# Patient Record
Sex: Female | Born: 1955 | Race: Black or African American | Hispanic: No | Marital: Single | Smoking: Never smoker
Health system: Southern US, Community
[De-identification: ages and names within clinical notes are randomized; demographics above are authoritative.]

## PROBLEM LIST (undated history)

## (undated) DIAGNOSIS — Z973 Presence of spectacles and contact lenses: Secondary | ICD-10-CM

## (undated) DIAGNOSIS — I456 Pre-excitation syndrome: Secondary | ICD-10-CM

## (undated) DIAGNOSIS — I10 Essential (primary) hypertension: Secondary | ICD-10-CM

## (undated) DIAGNOSIS — M1990 Unspecified osteoarthritis, unspecified site: Secondary | ICD-10-CM

## (undated) DIAGNOSIS — J40 Bronchitis, not specified as acute or chronic: Secondary | ICD-10-CM

## (undated) DIAGNOSIS — D649 Anemia, unspecified: Secondary | ICD-10-CM

## (undated) HISTORY — PX: TONSILLECTOMY: SUR1361

## (undated) HISTORY — PX: CHOLECYSTECTOMY: SHX55

## (undated) HISTORY — DX: Pre-excitation syndrome: I45.6

## (undated) HISTORY — DX: Anemia, unspecified: D64.9

---

## 2005-10-08 ENCOUNTER — Other Ambulatory Visit: Admission: RE | Admit: 2005-10-08 | Discharge: 2005-10-08 | Payer: Self-pay | Admitting: Obstetrics and Gynecology

## 2005-10-17 ENCOUNTER — Encounter: Admission: RE | Admit: 2005-10-17 | Discharge: 2005-10-17 | Payer: Self-pay | Admitting: Obstetrics and Gynecology

## 2006-07-30 ENCOUNTER — Other Ambulatory Visit: Admission: RE | Admit: 2006-07-30 | Discharge: 2006-07-30 | Payer: Self-pay | Admitting: Obstetrics and Gynecology

## 2006-08-04 ENCOUNTER — Encounter: Admission: RE | Admit: 2006-08-04 | Discharge: 2006-08-04 | Payer: Self-pay | Admitting: Obstetrics and Gynecology

## 2006-10-20 ENCOUNTER — Encounter: Admission: RE | Admit: 2006-10-20 | Discharge: 2006-10-20 | Payer: Self-pay | Admitting: Obstetrics and Gynecology

## 2007-06-25 ENCOUNTER — Encounter: Admission: RE | Admit: 2007-06-25 | Discharge: 2007-06-25 | Payer: Self-pay | Admitting: Occupational Medicine

## 2007-10-26 ENCOUNTER — Encounter: Admission: RE | Admit: 2007-10-26 | Discharge: 2007-10-26 | Payer: Self-pay | Admitting: Obstetrics and Gynecology

## 2008-10-27 ENCOUNTER — Encounter: Admission: RE | Admit: 2008-10-27 | Discharge: 2008-10-27 | Payer: Self-pay | Admitting: Obstetrics and Gynecology

## 2008-11-08 ENCOUNTER — Encounter: Admission: RE | Admit: 2008-11-08 | Discharge: 2008-11-08 | Payer: Self-pay | Admitting: Obstetrics and Gynecology

## 2009-11-02 ENCOUNTER — Encounter: Admission: RE | Admit: 2009-11-02 | Discharge: 2009-11-02 | Payer: Self-pay | Admitting: Obstetrics and Gynecology

## 2009-11-08 ENCOUNTER — Ambulatory Visit: Payer: Self-pay | Admitting: Internal Medicine

## 2009-11-08 ENCOUNTER — Encounter: Payer: Self-pay | Admitting: Internal Medicine

## 2009-11-08 ENCOUNTER — Inpatient Hospital Stay (HOSPITAL_COMMUNITY): Admission: EM | Admit: 2009-11-08 | Discharge: 2009-11-09 | Payer: Self-pay | Admitting: Emergency Medicine

## 2009-11-09 ENCOUNTER — Encounter: Admission: RE | Admit: 2009-11-09 | Discharge: 2009-11-09 | Payer: Self-pay | Admitting: Obstetrics and Gynecology

## 2009-11-18 HISTORY — PX: CARDIAC ELECTROPHYSIOLOGY MAPPING AND ABLATION: SHX1292

## 2009-11-20 ENCOUNTER — Encounter: Payer: Self-pay | Admitting: Internal Medicine

## 2009-11-27 ENCOUNTER — Encounter: Payer: Self-pay | Admitting: Internal Medicine

## 2009-12-05 ENCOUNTER — Ambulatory Visit: Payer: Self-pay | Admitting: Internal Medicine

## 2009-12-05 DIAGNOSIS — E876 Hypokalemia: Secondary | ICD-10-CM | POA: Insufficient documentation

## 2009-12-05 DIAGNOSIS — I4891 Unspecified atrial fibrillation: Secondary | ICD-10-CM | POA: Insufficient documentation

## 2009-12-05 DIAGNOSIS — I10 Essential (primary) hypertension: Secondary | ICD-10-CM | POA: Insufficient documentation

## 2009-12-05 DIAGNOSIS — I456 Pre-excitation syndrome: Secondary | ICD-10-CM

## 2009-12-14 ENCOUNTER — Ambulatory Visit: Payer: Self-pay | Admitting: Internal Medicine

## 2009-12-14 LAB — CONVERTED CEMR LAB
BUN: 16 mg/dL (ref 6–23)
Basophils Absolute: 0 10*3/uL (ref 0.0–0.1)
Basophils Relative: 0.4 % (ref 0.0–3.0)
CO2: 26 meq/L (ref 19–32)
Calcium: 9.3 mg/dL (ref 8.4–10.5)
Chloride: 104 meq/L (ref 96–112)
Creatinine, Ser: 0.8 mg/dL (ref 0.4–1.2)
Eosinophils Absolute: 0.1 10*3/uL (ref 0.0–0.7)
Eosinophils Relative: 1.9 % (ref 0.0–5.0)
GFR calc non Af Amer: 96.8 mL/min (ref >60)
Glucose, Bld: 89 mg/dL (ref 70–99)
HCT: 32.8 % — ABNORMAL LOW (ref 36.0–46.0)
Hemoglobin: 10.3 g/dL — ABNORMAL LOW (ref 12.0–15.0)
INR: 1 (ref 0.8–1.0)
Lymphocytes Relative: 40.8 % (ref 12.0–46.0)
Lymphs Abs: 1.8 10*3/uL (ref 0.7–4.0)
MCHC: 31.4 g/dL (ref 30.0–36.0)
MCV: 79 fL (ref 78.0–100.0)
Monocytes Absolute: 0.3 10*3/uL (ref 0.1–1.0)
Monocytes Relative: 6.3 % (ref 3.0–12.0)
Neutro Abs: 2.3 10*3/uL (ref 1.4–7.7)
Neutrophils Relative %: 50.6 % (ref 43.0–77.0)
Platelets: 255 10*3/uL (ref 150.0–400.0)
Potassium: 4 meq/L (ref 3.5–5.1)
Prothrombin Time: 10.1 s (ref 9.1–11.7)
RBC: 4.15 M/uL (ref 3.87–5.11)
RDW: 14.9 % — ABNORMAL HIGH (ref 11.5–14.6)
Sodium: 140 meq/L (ref 135–145)
WBC: 4.5 10*3/uL (ref 4.5–10.5)
aPTT: 31.3 s — ABNORMAL HIGH (ref 21.7–28.8)

## 2009-12-21 ENCOUNTER — Ambulatory Visit: Payer: Self-pay | Admitting: Internal Medicine

## 2009-12-21 ENCOUNTER — Inpatient Hospital Stay (HOSPITAL_COMMUNITY): Admission: RE | Admit: 2009-12-21 | Discharge: 2009-12-22 | Payer: Self-pay | Admitting: Internal Medicine

## 2009-12-27 ENCOUNTER — Encounter: Payer: Self-pay | Admitting: Internal Medicine

## 2010-02-07 ENCOUNTER — Ambulatory Visit: Payer: Self-pay | Admitting: Internal Medicine

## 2010-11-13 ENCOUNTER — Encounter
Admission: RE | Admit: 2010-11-13 | Discharge: 2010-11-13 | Payer: Self-pay | Source: Home / Self Care | Attending: Obstetrics and Gynecology | Admitting: Obstetrics and Gynecology

## 2010-12-18 NOTE — Letter (Signed)
Summary: ELectrophysiology/Ablation Procedure Instructions  [employer], Main Office  [address]   [place], [place] [address]   Phone: [phone]  Fax: [phone]     Electrophysiology/Ablation Procedure Instructions    You are scheduled for a(n)  ablation on 12/21/09 at 7:30am with Dr. Ladona Ridgel.  1.  Please come to the Short Stay Center at [facility] at 6:00am on the day of your procedure.  2.  Come prepared to stay overnight.   Please bring your insurance cards and a list of your medications.  3.  Come to the [place] office on 12/14/09 at 9:00am for lab work.  The lab at [facility] is open from 8:30 AM to 1:30 PM and 2:30 PM to 5:00 PM.  .  You do not have to be fasting.  4.  Do not have anything to eat or drink after midnight the night before your procedure.  5. All of your  medications may be taken with a small amount of water.  6.  Educational material received:  _____ EP   _____ Ablation   * Occasionally, EP studies and ablations can become lengthy.  Please make your family aware of this before your procedure starts.  Average time ranges from 2-8 hours for EP studies/ablations.  Your physician will locate your family after the procedure with the results.  * If you have any questions after you get home, please call the office at [phone]. Anselm Pancoast

## 2010-12-18 NOTE — Assessment & Plan Note (Signed)
Summary: eph/ gd   CC:  follow up pt states she feels like her heart is speending up from time to time.  History of Present Illness: [name] returns today for followup.  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  She underwent EPS/RFA of her WPW syndrome several weeks ago.  She returns today for followup.  She denies recurrent symptoms.  Current Medications (verified): 1)  Losartan Potassium-Hctz 50-12.5 Mg Tabs (Losartan Potassium-Hctz) .Marland Kitchen.. 1 Tab Po Once Daily 2)  Aspirin Ec 325 Mg Tbec (Aspirin) .... Take One Tablet By Mouth Daily 3)  Potassium Chloride Crys Cr 20 Meq Cr-Tabs (Potassium Chloride Crys Cr) .... Take One Tablet By Mouth Daily 4)  Multivitamins   Tabs (Multiple Vitamin) .... Take One Tablet By Mouth Once Daily. 5)  Caltrate 600+d 600-400 Mg-Unit Tabs (Calcium Carbonate-Vitamin D) .Marland Kitchen.. 1 Tablet 3-4 Times Per Week  Allergies: 1)  ! Cardizem  Past History:  Past Medical History: Last updated: 12/05/2009 Current Problems:  ATRIAL FIBRILLATION (ICD-427.31) HYPOKALEMIA (ICD-276.8) HYPERTENSION (ICD-401.9) WOLFF (WOLFE)-PARKINSON-WHITE (WPW) SYNDROME (ICD-426.7)    Past Surgical History: Last updated: 12/05/2009 Cholecystectomy 1991 Tonsilectomy 2000  Review of Systems  The patient denies chest pain, syncope, dyspnea on exertion, and peripheral edema.    Vital Signs:  Patient profile:   55 year old female Height:      65 inches Weight:      260 pounds BMI:     43.42 Pulse rate:   84 / minute Resp:     14 per minute BP sitting:   104 / 73  (left arm)  Vitals Entered By: Kem Parkinson (February 07, 2010 9:44 AM)  Physical Exam  General:  Obes, middle aged woman well developed, well nourished, in no acute distress.  HEENT: normal Neck: supple. No JVD. Carotids 2+ bilaterally no bruits Cor: RRR no rubs, gallops or murmur Lungs: CTA [id number], soft, nontender. nondistended. No HSM. Good bowel sounds Ext: warm. no cyanosis, clubbing or  edema Neuro: alert and oriented. Grossly nonfocal. affect pleasant    EKG  Procedure date:  02/07/2010  Findings:      Normal sinus rhythm with rate of:  84. No ventricular pre-excitation is present.  Impression & Recommendations:  Problem # 1:  WOLFF (WOLFE)-PARKINSON-WHITE (WPW) SYNDROME (ICD-426.7) She has no evidence of recurrent WPW syndrome at this time.  She will continue a period of watchful waiting.  The likelihood of this recurring is very low. Her updated medication list for this problem includes:    Aspirin Ec 325 Mg Tbec (Aspirin) .Marland Kitchen... Take one tablet by mouth daily  Problem # 2:  MORBID OBESITY (ICD-278.01) Today, I have discussed the importance of weight loss and its implications.  She will try to lose weight.

## 2010-12-18 NOTE — Assessment & Plan Note (Signed)
Summary: new ep eval/afib/jss   History of Present Illness: Denise Deleon is referred for evaluation of WPW syndrome.  The patient notes that she has had palpitations for over 20 yrs.  These episodes have typically occured suddenly without warning, lasting up to several hrs at a time.  She has associated c/p, sob, and near syncope.  She has never had frank syncope.  The patient has noted that over the past year, her symptoms have increased in frequency and severity.  She was hospitalized with a severe episode almost one month ago and at that time she was in sustained SVT at almost 200/min.  She has tried calcium channel blockers without improvement (also used for HTN).  Current Medications (verified): 1)  Hydrochlorothiazide 25 Mg Tabs (Hydrochlorothiazide) .... Take One Tablet By Mouth Daily. 2)  Aspirin Ec 325 Mg Tbec (Aspirin) .... Take One Tablet By Mouth Daily 3)  Potassium Chloride Crys Cr 20 Meq Cr-Tabs (Potassium Chloride Crys Cr) .... Take One Tablet By Mouth Daily 4)  Multivitamins   Tabs (Multiple Vitamin) .... Take One Tablet By Mouth Once Daily. 5)  Caltrate 600+d 600-400 Mg-Unit Tabs (Calcium Carbonate-Vitamin D) .Marland Kitchen.. 1 Tablet 3-4 Times Per Week  Allergies (verified): 1)  ! Cardizem  Past History:  Past Medical History: Last updated: 12/05/2009 Current Problems:  ATRIAL FIBRILLATION (ICD-427.31) HYPOKALEMIA (ICD-276.8) HYPERTENSION (ICD-401.9) WOLFF (WOLFE)-PARKINSON-WHITE (WPW) SYNDROME (ICD-426.7)    Past Surgical History: Cholecystectomy 1991 Tonsilectomy Apr 03, 1999  Family History: Mother died of a stroke 5 Father died of throat CA  Social History:  No tobacco, alcohol or drugs.  Single.  Review of Systems       All systems reviewed and negative  except for some mild arthritic complaints in the hands.  Vital Signs:  Patient profile:   55 year old female Height:      65 inches Weight:      265 pounds BMI:     44.26 Pulse rate:   81 / minute BP sitting:   132 /  78  (left arm)  Vitals Entered By: Laurance Flatten CMA (December 05, 2009 12:53 PM)  Physical Exam  General:  Obes, middle aged woman well developed, well nourished, in no acute distress.  HEENT: normal Neck: supple. No JVD. Carotids 2+ bilaterally no bruits Cor: RRR no rubs, gallops or murmur Lungs: CTA [id number], soft, nontender. nondistended. No HSM. Good bowel sounds Ext: warm. no cyanosis, clubbing or edema Neuro: alert and oriented. Grossly nonfocal. affect pleasant    EKG  Procedure date:  12/05/2009  Findings:      Normal sinus rhythm with rate of:  81. WPW synrdrome.  Impression & Recommendations:  Problem # 1:  WOLFF (WOLFE)-PARKINSON-WHITE (WPW) SYNDROME (ICD-426.7) The patient has symptoms, documented SVT and despite medical therapy has had recurrent episodes (not as symptomatic has previously).  I have discussed the risks/benefits/goals/and expectations of ablation with the patient and she would like to proceed.  Her procedure will be scheduled at the earlist possible convenient time. Her updated medication list for this problem includes:    Aspirin Ec 325 Mg Tbec (Aspirin) .Marland Kitchen... Take one tablet by mouth daily  Orders: EKG w/ Interpretation (93[address])  Problem # 2:  HYPERTENSION (ICD-401.9) She will continue her current meds and maintain a low sodium diet. I have encouraged her in weight loss. Her updated medication list for this problem includes:    Hydrochlorothiazide 25 Mg Tabs (Hydrochlorothiazide) .Marland Kitchen... Take one tablet by mouth daily.    Aspirin Ec 325 Mg  Tbec (Aspirin) .Marland Kitchen... Take one tablet by mouth daily  Problem # 3:  ATRIAL FIBRILLATION (ICD-427.31) I cannot find any evidence of atrial fibrillation though with her SVT and pre-excited QRS, she is at risk for atrial fibrillation. Her updated medication list for this problem includes:    Aspirin Ec 325 Mg Tbec (Aspirin) .Marland Kitchen... Take one tablet by mouth daily  Orders: EKG w/ Interpretation ([address])

## 2010-12-18 NOTE — Letter (Signed)
Summary: [employer] Authorization  [employer] Authorization   Imported By: Roderic Ovens 02/22/2010 14:51:41  _____________________________________________________________________  External Attachment:    Type:   Image     Comment:   External Document

## 2010-12-18 NOTE — Letter (Signed)
Summary: Denise Deleon Physician OV with GYN History  [facility] OV with GYN History   Imported By: Roderic Ovens 12/19/2009 11:16:39  _____________________________________________________________________  External Attachment:    Type:   Image     Comment:   External Document

## 2011-02-18 LAB — POCT CARDIAC MARKERS
CKMB, poc: <1 ng/mL — ABNORMAL LOW (ref 1.0–8.0)
Myoglobin, poc: 63 ng/mL (ref 12–200)
Troponin i, poc: <0.05 ng/mL (ref 0.00–0.09)

## 2011-02-18 LAB — BASIC METABOLIC PANEL
BUN: 10 mg/dL (ref 6–23)
BUN: 7 mg/dL (ref 6–23)
CO2: 21 mEq/L (ref 19–32)
CO2: 26 mEq/L (ref 19–32)
Calcium: 8.6 mg/dL (ref 8.4–10.5)
Calcium: 8.6 mg/dL (ref 8.4–10.5)
Chloride: 107 mEq/L (ref 96–112)
Chloride: 98 mEq/L (ref 96–112)
Creatinine, Ser: 0.64 mg/dL (ref 0.4–1.2)
Creatinine, Ser: 0.67 mg/dL (ref 0.4–1.2)
GFR calc Af Amer: >60 mL/min (ref >60)
GFR calc Af Amer: >60 mL/min (ref >60)
GFR calc non Af Amer: >60 mL/min (ref >60)
GFR calc non Af Amer: >60 mL/min (ref >60)
Glucose, Bld: 98 mg/dL (ref 70–99)
Glucose, Bld: 99 mg/dL (ref 70–99)
Potassium: 3.4 mEq/L — ABNORMAL LOW (ref 3.5–5.1)
Potassium: 3.4 mEq/L — ABNORMAL LOW (ref 3.5–5.1)
Sodium: 130 mEq/L — ABNORMAL LOW (ref 135–145)
Sodium: 140 mEq/L (ref 135–145)

## 2011-02-18 LAB — URINALYSIS, ROUTINE W REFLEX MICROSCOPIC
Bilirubin Urine: NEGATIVE
Glucose, UA: NEGATIVE mg/dL
Hgb urine dipstick: NEGATIVE
Ketones, ur: NEGATIVE mg/dL
Nitrite: NEGATIVE
Protein, ur: NEGATIVE mg/dL
Specific Gravity, Urine: 1.005 (ref 1.005–1.030)
Urobilinogen, UA: 0.2 mg/dL (ref 0.0–1.0)
pH: 6.5 (ref 5.0–8.0)

## 2011-02-18 LAB — DIFFERENTIAL
Basophils Absolute: 0 10*3/uL (ref 0.0–0.1)
Basophils Relative: 1 % (ref 0–1)
Eosinophils Absolute: 0.1 10*3/uL (ref 0.0–0.7)
Eosinophils Relative: 1 % (ref 0–5)
Lymphocytes Relative: 47 % — ABNORMAL HIGH (ref 12–46)
Lymphs Abs: 3.6 10*3/uL (ref 0.7–4.0)
Monocytes Absolute: 0.4 10*3/uL (ref 0.1–1.0)
Monocytes Relative: 5 % (ref 3–12)
Neutro Abs: 3.6 10*3/uL (ref 1.7–7.7)
Neutrophils Relative %: 47 % (ref 43–77)

## 2011-02-18 LAB — CBC
HCT: 31.8 % — ABNORMAL LOW (ref 36.0–46.0)
Hemoglobin: 10.3 g/dL — ABNORMAL LOW (ref 12.0–15.0)
MCHC: 32.5 g/dL (ref 30.0–36.0)
MCV: 76.9 fL — ABNORMAL LOW (ref 78.0–100.0)
Platelets: 258 10*3/uL (ref 150–400)
RBC: 4.14 MIL/uL (ref 3.87–5.11)
RDW: 15.2 % (ref 11.5–15.5)
WBC: 7.7 10*3/uL (ref 4.0–10.5)

## 2011-02-18 LAB — TSH: TSH: 2.236 u[IU]/mL (ref 0.350–4.500)

## 2011-02-18 LAB — PHOSPHORUS: Phosphorus: 4.9 mg/dL — ABNORMAL HIGH (ref 2.3–4.6)

## 2011-02-18 LAB — MAGNESIUM: Magnesium: 2 mg/dL (ref 1.5–2.5)

## 2011-06-18 ENCOUNTER — Encounter: Payer: Self-pay | Admitting: Podiatry

## 2011-07-03 ENCOUNTER — Ambulatory Visit: Payer: Self-pay

## 2011-07-03 ENCOUNTER — Other Ambulatory Visit: Payer: Self-pay | Admitting: Occupational Medicine

## 2011-07-03 DIAGNOSIS — R52 Pain, unspecified: Secondary | ICD-10-CM

## 2011-09-05 IMAGING — CR DG HAND COMPLETE 3+V*L*
4 series · 4 of 4 positions shown · non-contrast
Comparison: None.

CLINICAL DATA: Injured hand yesterday with pain

LEFT HAND - COMPLETE 3+ VIEW

[view not recorded (1 of 4)]
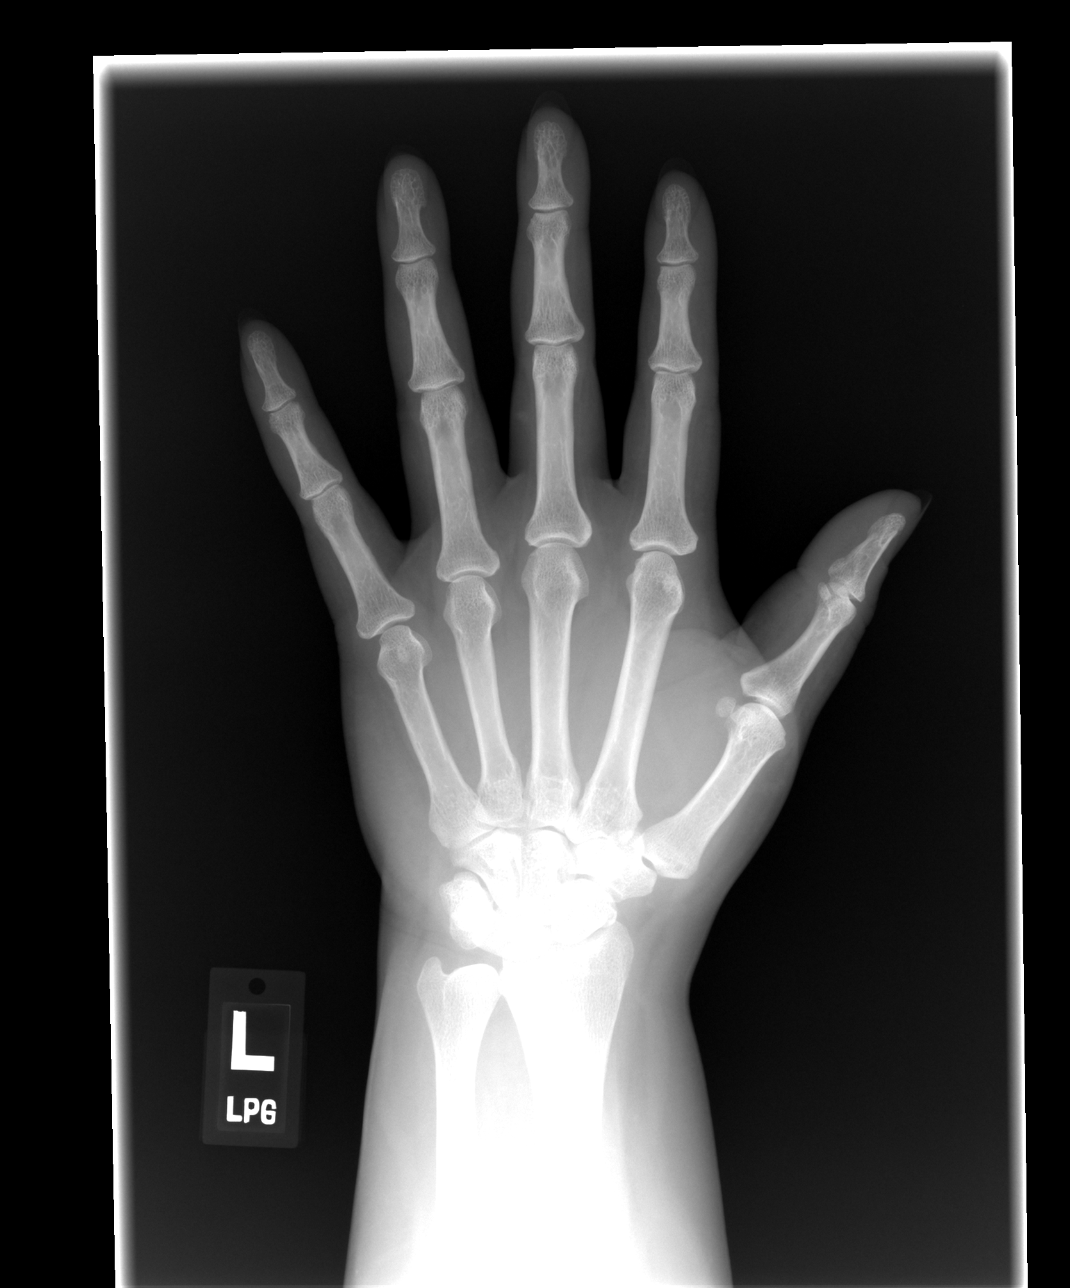

[view not recorded (2 of 4)]
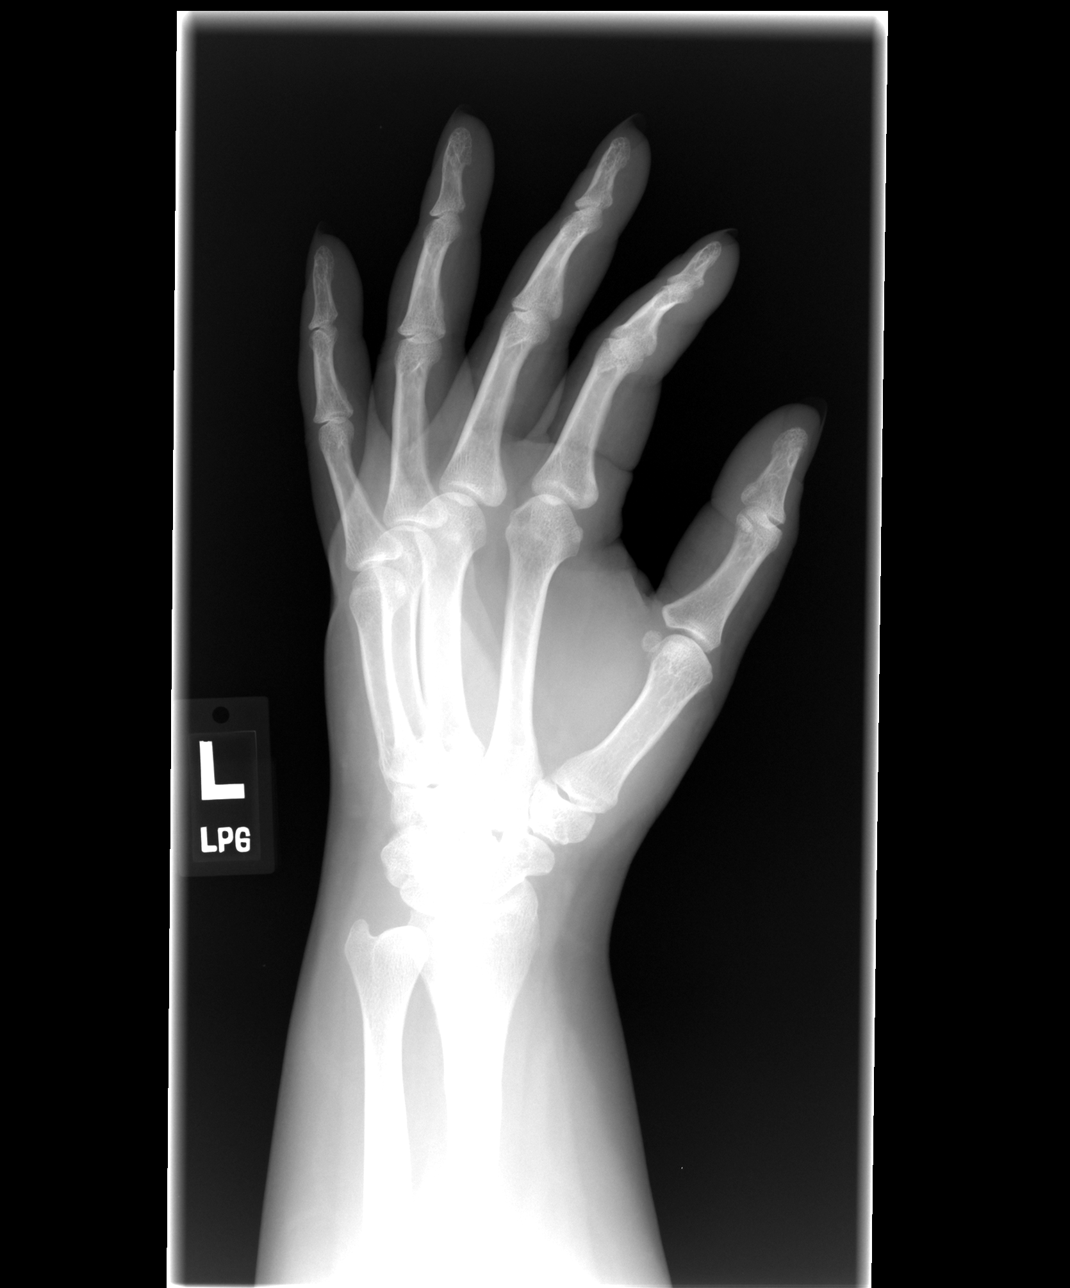

[view not recorded (3 of 4)]
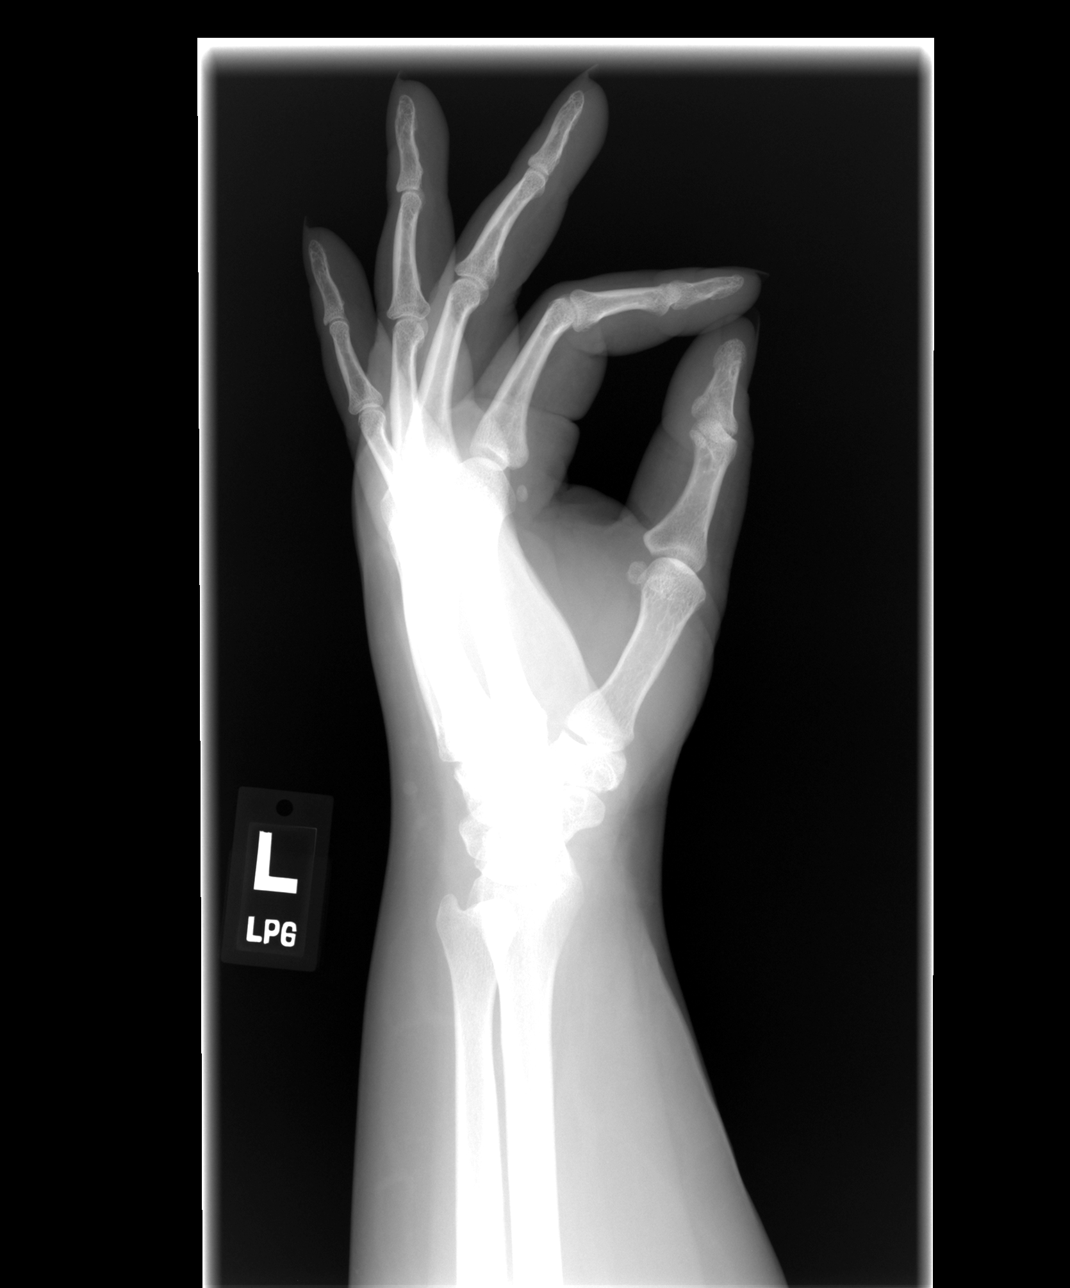

[view not recorded (4 of 4)]
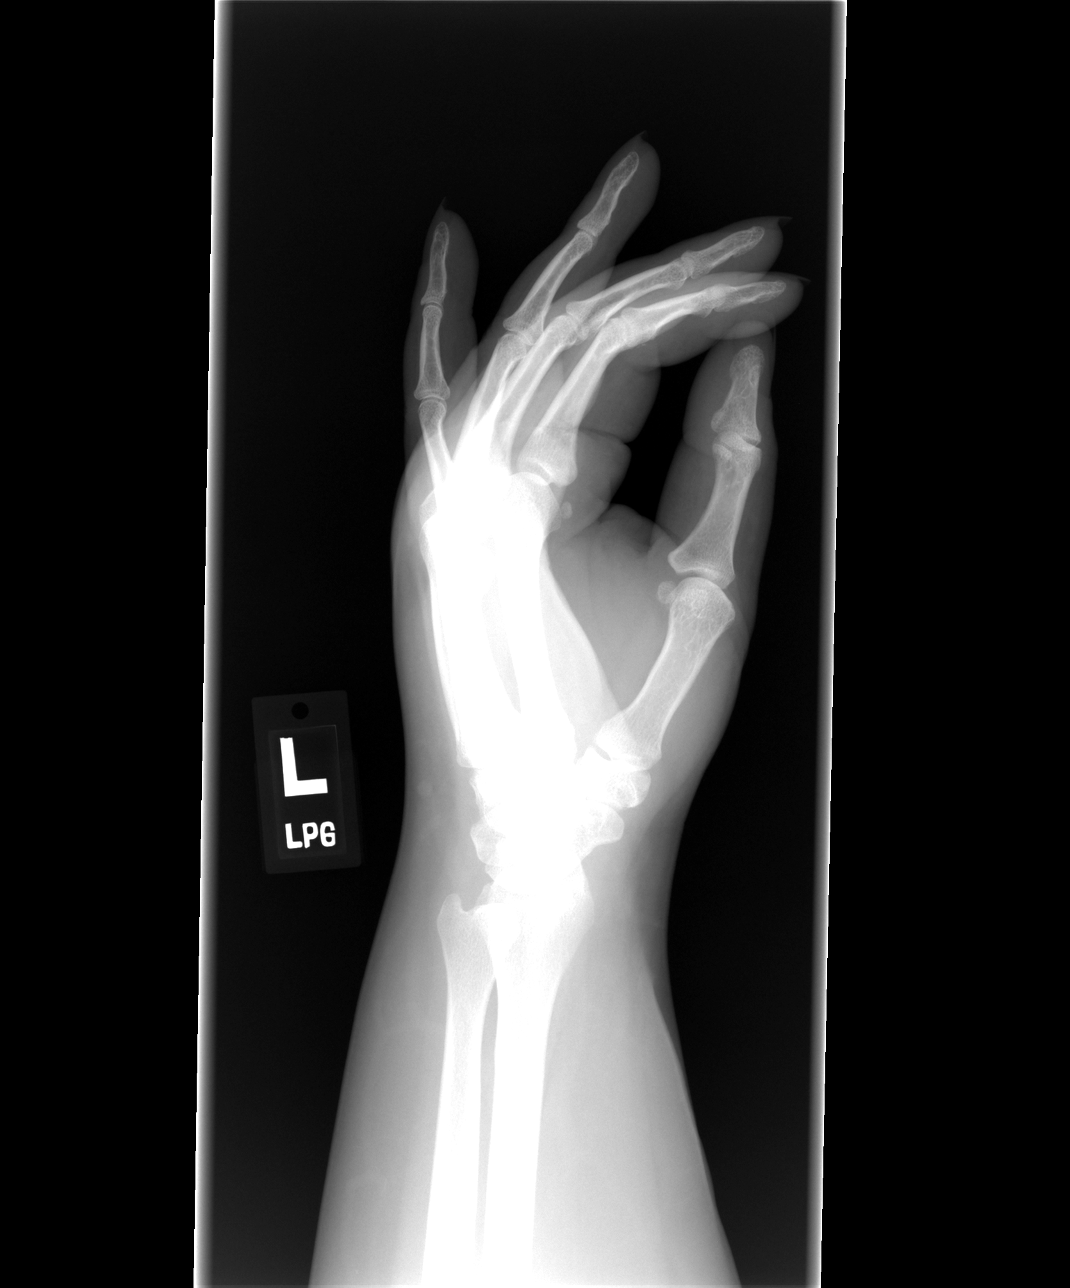

[4 of 4 positions shown; findings below may reference images not displayed]

FINDINGS: The radiocarpal joint space appears normal.  The carpal
bones are in normal position.  MCP, PIP, and DIP joints appear
normal.  No acute abnormality is seen
IMPRESSION: Negative.

## 2011-10-15 ENCOUNTER — Other Ambulatory Visit: Payer: Self-pay | Admitting: Obstetrics and Gynecology

## 2011-10-15 DIAGNOSIS — Z1231 Encounter for screening mammogram for malignant neoplasm of breast: Secondary | ICD-10-CM

## 2011-11-15 ENCOUNTER — Ambulatory Visit
Admission: RE | Admit: 2011-11-15 | Discharge: 2011-11-15 | Disposition: A | Discharge status: Home / Self Care | Payer: BC Managed Care – PPO | Source: Ambulatory Visit | Attending: Obstetrics and Gynecology | Admitting: Obstetrics and Gynecology

## 2011-11-15 DIAGNOSIS — Z1231 Encounter for screening mammogram for malignant neoplasm of breast: Secondary | ICD-10-CM

## 2012-01-17 DIAGNOSIS — M1990 Unspecified osteoarthritis, unspecified site: Secondary | ICD-10-CM

## 2012-01-17 HISTORY — DX: Unspecified osteoarthritis, unspecified site: M19.90

## 2012-09-24 ENCOUNTER — Emergency Department (HOSPITAL_COMMUNITY)
Admission: EM | Admit: 2012-09-24 | Discharge: 2012-09-24 | Disposition: A | Discharge status: Home / Self Care | Payer: BC Managed Care – PPO | Attending: Emergency Medicine | Admitting: Emergency Medicine

## 2012-09-24 ENCOUNTER — Encounter (HOSPITAL_COMMUNITY): Payer: Self-pay

## 2012-09-24 DIAGNOSIS — Y9389 Activity, other specified: Secondary | ICD-10-CM | POA: Insufficient documentation

## 2012-09-24 DIAGNOSIS — S199XXA Unspecified injury of neck, initial encounter: Secondary | ICD-10-CM

## 2012-09-24 DIAGNOSIS — D649 Anemia, unspecified: Secondary | ICD-10-CM | POA: Insufficient documentation

## 2012-09-24 DIAGNOSIS — G20A1 Parkinson's disease without dyskinesia, without mention of fluctuations: Secondary | ICD-10-CM | POA: Insufficient documentation

## 2012-09-24 DIAGNOSIS — G20 Parkinson's disease: Secondary | ICD-10-CM | POA: Insufficient documentation

## 2012-09-24 DIAGNOSIS — M129 Arthropathy, unspecified: Secondary | ICD-10-CM | POA: Insufficient documentation

## 2012-09-24 DIAGNOSIS — I10 Essential (primary) hypertension: Secondary | ICD-10-CM | POA: Insufficient documentation

## 2012-09-24 DIAGNOSIS — S0993XA Unspecified injury of face, initial encounter: Secondary | ICD-10-CM | POA: Insufficient documentation

## 2012-09-24 DIAGNOSIS — Z79899 Other long term (current) drug therapy: Secondary | ICD-10-CM | POA: Insufficient documentation

## 2012-09-24 HISTORY — DX: Unspecified osteoarthritis, unspecified site: M19.90

## 2012-09-24 HISTORY — DX: Essential (primary) hypertension: I10
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
## 2012-10-13 ENCOUNTER — Other Ambulatory Visit: Payer: Self-pay | Admitting: Obstetrics and Gynecology

## 2012-10-13 DIAGNOSIS — Z1231 Encounter for screening mammogram for malignant neoplasm of breast: Secondary | ICD-10-CM

## 2012-11-17 ENCOUNTER — Ambulatory Visit
Admission: RE | Admit: 2012-11-17 | Discharge: 2012-11-17 | Disposition: A | Discharge status: Home / Self Care | Payer: BC Managed Care – PPO | Source: Ambulatory Visit | Attending: Obstetrics and Gynecology | Admitting: Obstetrics and Gynecology

## 2012-11-17 DIAGNOSIS — Z1231 Encounter for screening mammogram for malignant neoplasm of breast: Secondary | ICD-10-CM

## 2012-11-19 ENCOUNTER — Other Ambulatory Visit: Payer: Self-pay | Admitting: Obstetrics and Gynecology

## 2012-11-19 DIAGNOSIS — N6310 Unspecified lump in the right breast, unspecified quadrant: Secondary | ICD-10-CM

## 2012-11-23 NOTE — Progress Notes (Signed)
Quick Note:  The recent mammogram is incomplete / abnormal suggesting a close follow-up / additional images. When is the next appointment to follow-up on this mammogram?  Please document in chart. ______ 

## 2012-11-24 ENCOUNTER — Telehealth: Payer: Self-pay

## 2012-11-24 NOTE — Telephone Encounter (Signed)
Message copied by Darien Ramus on Tue Nov 24, 2012  2:53 PM ------      Message from: Larwance Rote      Created: Tue Nov 24, 2012 11:16 AM                   ----- Message -----         From: Esmeralda Arthur, MD         Sent: 11/23/2012   4:29 PM           To: Butler Denmark, CMA            The recent mammogram is incomplete / abnormal suggesting a close follow-up / additional images.      When is the next appointment to follow-up on this mammogram?       Please document in chart.

## 2012-11-24 NOTE — Telephone Encounter (Signed)
Per [facility] pt is scheduled for f/u MM on 11/25/2012  Darien Ramus, CMA

## 2012-11-25 ENCOUNTER — Ambulatory Visit
Admission: RE | Admit: 2012-11-25 | Discharge: 2012-11-25 | Disposition: A | Discharge status: Home / Self Care | Payer: BC Managed Care – PPO | Source: Ambulatory Visit | Attending: Obstetrics and Gynecology | Admitting: Obstetrics and Gynecology

## 2012-11-25 DIAGNOSIS — N6310 Unspecified lump in the right breast, unspecified quadrant: Secondary | ICD-10-CM

## 2012-11-26 ENCOUNTER — Encounter: Payer: Self-pay | Admitting: Obstetrics and Gynecology

## 2012-11-26 ENCOUNTER — Ambulatory Visit (INDEPENDENT_AMBULATORY_CARE_PROVIDER_SITE_OTHER): Payer: BC Managed Care – PPO | Admitting: Obstetrics and Gynecology

## 2012-11-26 VITALS — BP 118/76 | HR 82 | Ht 64.25 in | Wt 261.0 lb

## 2012-11-26 DIAGNOSIS — R3915 Urgency of urination: Secondary | ICD-10-CM

## 2012-11-26 DIAGNOSIS — Z124 Encounter for screening for malignant neoplasm of cervix: Secondary | ICD-10-CM

## 2012-11-26 DIAGNOSIS — Z01419 Encounter for gynecological examination (general) (routine) without abnormal findings: Secondary | ICD-10-CM

## 2012-11-26 NOTE — Progress Notes (Signed)
Subjective:    Denise Deleon is a 57 y.o. female G0P0 who presents for annual exam. The patient has no complaints today but wants a "knot" that is asymptomatic, evaluated.  This knot is in her lower abdomen and she only feels it when she is standing-not lying down.    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  Review of Systems Gastrointestinal:No change in bowel habits, no abdominal pain, no rectal bleeding Genitourinary:negative for abnormal vaginal bleeding,  dysuria, frequency, hematuria, nocturia and urinary incontinence   Objective:     BP 118/76  Pulse 82  Ht 5' 4.25" (1.632 m)  Wt 261 lb (118.389 kg)  BMI 44.45 kg/m2 Weight:  Wt Readings from Last 1 Encounters:  11/26/12 261 lb (118.389 kg)   Body mass index is 44.45 kg/(m^2). General Appearance:  Well nourished in no acute distress HEENT: Grossly normal Neck / Thyroid: Supple, no masses, nodes or enlargement Lungs: Clear to auscultation bilaterally Back: No CVA tenderness Breast Exam: No masses or nodes.No dimpling, nipple retraction or discharge. Cardiovascular: Regular rate and rhythm.  Gastrointestinal: Soft, non-tender;  left lower quadrant, 3 fingers above hairline organomegaly ? defect vs firm mass (approx. 1.5 cm) that is non-tender and is only palpable with standing Pelvic Exam: EGBUS-normal, vagina-atrophic/narrowed, cervix-atrophic and stenotic no tenderness or lesions, uterus-appears normal size, shape and consistency, though exam limited by habitus,  adnexae-no masses or tenderness Rectovaginal: inadequate due to patient discomfort Lymphatic Exam: Non-palpable nodes in neck, clavicular, axillary, or inguinal regions Skin: No rash or abnormalities Extremities: no clubbing cyanosis or edema Neurologic: Grossly normal Psychiatric: Alert and oriented x 3    Assessment:   Routine GYN Exam Left Lower Quadrant Abdominal Wall Defect/Hernia?   Plan:   PAP sent  Patient to notify office if she  wants evaluation of ? abdominal wall defect/hernia  RTO 1 year or prn   Ladeana Laplant,ELMIRAPA-C

## 2012-11-26 NOTE — Progress Notes (Signed)
Regular Periods: no Mammogram: yes  Monthly Breast Ex.: yes Exercise: no  Tetanus < 10 years: yes Seatbelts: yes  NI. Bladder Functn.: yes Abuse at home: no  Daily BM's: yes Stressful Work: yes  Healthy Diet: yes Sigmoid-Colonoscopy: YES  2007/08  Calcium: yes Medical problems this year: 2 LUMPS ON LEFT SIDE IN PUBIC AREA   LAST PAP:1/13  Contraception: PM  Mammogram:  11-17-12;U/S  11/25/12  PCP: DR. POLITE  PMH: RHEUMATOID ARTHRITIS DX 3/13  FMH: NO CHANGE  Last Bone Scan: NEVER   PT IS SINGLE.

## 2012-11-27 LAB — PAP IG W/ RFLX HPV ASCU

## 2012-12-01 ENCOUNTER — Other Ambulatory Visit: Payer: Self-pay | Admitting: Obstetrics and Gynecology

## 2012-12-01 ENCOUNTER — Telehealth: Payer: Self-pay | Admitting: Obstetrics and Gynecology

## 2012-12-01 DIAGNOSIS — R1904 Left lower quadrant abdominal swelling, mass and lump: Secondary | ICD-10-CM

## 2012-12-01 NOTE — Telephone Encounter (Signed)
Patient who reported a palpable mass within left lower abdominal skin surface, request further evaluation for possible hernia (as a defect was felt during exam).  Patient is to be referred to a General Surgeon for evaluation and management of a possible left anterior wall defect.  Nikolaos Maddocks, PA-C

## 2012-12-01 NOTE — Telephone Encounter (Signed)
Ep, latoya sent a message to you concerning this pt.

## 2012-12-01 NOTE — Telephone Encounter (Signed)
Message routed to EP, PA regarding Referral.   LC CMA

## 2012-12-08 ENCOUNTER — Ambulatory Visit (INDEPENDENT_AMBULATORY_CARE_PROVIDER_SITE_OTHER): Payer: BC Managed Care – PPO | Admitting: Surgery

## 2012-12-08 ENCOUNTER — Encounter (INDEPENDENT_AMBULATORY_CARE_PROVIDER_SITE_OTHER): Payer: Self-pay | Admitting: Surgery

## 2012-12-08 VITALS — BP 126/84 | HR 84 | Temp 98.2°F | Resp 16 | Ht 64.38 in | Wt 261.0 lb

## 2012-12-08 DIAGNOSIS — R1032 Left lower quadrant pain: Secondary | ICD-10-CM | POA: Insufficient documentation
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
## 2013-01-02 ENCOUNTER — Other Ambulatory Visit: Payer: Self-pay

## 2013-01-12 ENCOUNTER — Ambulatory Visit (INDEPENDENT_AMBULATORY_CARE_PROVIDER_SITE_OTHER): Payer: BC Managed Care – PPO | Admitting: Surgery

## 2013-01-12 ENCOUNTER — Encounter (INDEPENDENT_AMBULATORY_CARE_PROVIDER_SITE_OTHER): Payer: Self-pay | Admitting: Surgery

## 2013-01-12 VITALS — BP 134/62 | HR 76 | Temp 97.4°F | Resp 16 | Ht 64.25 in | Wt 265.4 lb

## 2013-01-12 DIAGNOSIS — K4090 Unilateral inguinal hernia, without obstruction or gangrene, not specified as recurrent: Secondary | ICD-10-CM

## 2013-01-12 NOTE — Progress Notes (Signed)
Subjective:     Patient ID: Denise Deleon, female   DOB: [birth year], 57 y.o.   MRN: [id number]  HPI She is here for another evaluation of her left groin pain. It is pretty much unchanged from the past month. She will notice an occasional bulge in the left groin.  Review of Systems     Objective:   Physical Exam On exam, there is a very subtle left inguinal hernia which is easily reducible and mildly tender    Assessment:     Left inguinal hernia     Plan:     We discussed options. She would like to proceed with left inguinal hernia repair with mesh. I discussed this with her in detail. Surgery will be scheduled

## 2013-01-19 ENCOUNTER — Encounter (HOSPITAL_BASED_OUTPATIENT_CLINIC_OR_DEPARTMENT_OTHER): Payer: Self-pay | Admitting: *Deleted

## 2013-01-19 NOTE — Progress Notes (Signed)
To come in for bmet-ekg-had cardiac ablation 2011-did fu with dr h smith-not since then-has not had any arrythmia problems

## 2013-01-21 ENCOUNTER — Ambulatory Visit (HOSPITAL_BASED_OUTPATIENT_CLINIC_OR_DEPARTMENT_OTHER)
Admission: RE | Admit: 2013-01-21 | Discharge: 2013-01-21 | Disposition: A | Discharge status: Home / Self Care | Payer: BC Managed Care – PPO | Source: Ambulatory Visit | Attending: Surgery | Admitting: Surgery

## 2013-01-21 DIAGNOSIS — Z01810 Encounter for preprocedural cardiovascular examination: Secondary | ICD-10-CM | POA: Insufficient documentation

## 2013-01-21 LAB — BASIC METABOLIC PANEL
GFR calc Af Amer: >90 mL/min (ref >90)
GFR calc non Af Amer: >90 mL/min (ref >90)
Potassium: 3.6 mEq/L (ref 3.5–5.1)
Sodium: 140 mEq/L (ref 135–145)
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
## 2013-01-22 ENCOUNTER — Encounter (HOSPITAL_BASED_OUTPATIENT_CLINIC_OR_DEPARTMENT_OTHER)
Admission: RE | Disposition: A | Discharge status: Home / Self Care | Payer: Self-pay | Source: Ambulatory Visit | Attending: Surgery

## 2013-01-22 ENCOUNTER — Encounter (HOSPITAL_BASED_OUTPATIENT_CLINIC_OR_DEPARTMENT_OTHER): Payer: Self-pay | Admitting: Certified Registered"

## 2013-01-22 ENCOUNTER — Ambulatory Visit (HOSPITAL_BASED_OUTPATIENT_CLINIC_OR_DEPARTMENT_OTHER): Payer: BC Managed Care – PPO | Admitting: Certified Registered"

## 2013-01-22 ENCOUNTER — Ambulatory Visit (HOSPITAL_BASED_OUTPATIENT_CLINIC_OR_DEPARTMENT_OTHER)
Admission: RE | Admit: 2013-01-22 | Discharge: 2013-01-22 | Disposition: A | Discharge status: Home / Self Care | Payer: BC Managed Care – PPO | Source: Ambulatory Visit | Attending: Surgery | Admitting: Surgery

## 2013-01-22 DIAGNOSIS — D649 Anemia, unspecified: Secondary | ICD-10-CM | POA: Insufficient documentation

## 2013-01-22 DIAGNOSIS — M069 Rheumatoid arthritis, unspecified: Secondary | ICD-10-CM | POA: Insufficient documentation

## 2013-01-22 DIAGNOSIS — K4090 Unilateral inguinal hernia, without obstruction or gangrene, not specified as recurrent: Secondary | ICD-10-CM

## 2013-01-22 DIAGNOSIS — I10 Essential (primary) hypertension: Secondary | ICD-10-CM | POA: Insufficient documentation

## 2013-01-22 HISTORY — PX: INGUINAL HERNIA REPAIR: SHX194

## 2013-01-22 HISTORY — PX: INSERTION OF MESH: SHX5868

## 2013-01-22 HISTORY — DX: Bronchitis, not specified as acute or chronic: J40

## 2013-01-22 HISTORY — DX: Presence of spectacles and contact lenses: Z97.3

## 2013-01-22 SURGERY — REPAIR, HERNIA, INGUINAL, ADULT
Anesthesia: General | Site: Groin | Laterality: Left | Wound class: Clean

## 2013-01-22 MED ORDER — EPHEDRINE SULFATE 50 MG/ML IJ SOLN
INTRAMUSCULAR | Status: DC | PRN
  Administered 2013-01-22: 5 mg via INTRAVENOUS
  Administered 2013-01-22: 10 mg via INTRAVENOUS

## 2013-01-22 MED ORDER — DEXAMETHASONE SODIUM PHOSPHATE 4 MG/ML IJ SOLN
INTRAMUSCULAR | Status: DC | PRN
  Administered 2013-01-22: 8 mg via INTRAVENOUS

## 2013-01-22 MED ORDER — LIDOCAINE HCL (CARDIAC) 20 MG/ML IV SOLN
INTRAVENOUS | Status: DC | PRN
  Administered 2013-01-22: 100 mg via INTRAVENOUS

## 2013-01-22 MED ORDER — ACETAMINOPHEN 10 MG/ML IV SOLN
INTRAVENOUS | Status: DC | PRN
  Administered 2013-01-22: 1000 mg via INTRAVENOUS

## 2013-01-22 MED ORDER — FENTANYL CITRATE 0.05 MG/ML IJ SOLN
INTRAMUSCULAR | Status: DC | PRN
  Administered 2013-01-22: 100 ug via INTRAVENOUS

## 2013-01-22 MED ORDER — ONDANSETRON HCL 4 MG/2ML IJ SOLN: 4.0000 mg | Freq: Once | INTRAMUSCULAR | Status: DC | PRN

## 2013-01-22 MED ORDER — PROPOFOL 10 MG/ML IV BOLUS
INTRAVENOUS | Status: DC | PRN
  Administered 2013-01-22: 200 mg via INTRAVENOUS

## 2013-01-22 MED ORDER — HYDROCODONE-ACETAMINOPHEN 5-325 MG PO TABS: 1.0000 | ORAL_TABLET | ORAL | Status: DC | PRN

## 2013-01-22 MED ORDER — BUPIVACAINE HCL (PF) 0.5 % IJ SOLN
INTRAMUSCULAR | Status: DC | PRN
  Administered 2013-01-22: 20 mL

## 2013-01-22 MED ORDER — FENTANYL CITRATE 0.05 MG/ML IJ SOLN
50.0000 ug | INTRAMUSCULAR | Status: DC | PRN
  Administered 2013-01-22: 100 ug via INTRAVENOUS

## 2013-01-22 MED ORDER — CEFAZOLIN SODIUM-DEXTROSE 2-3 GM-% IV SOLR
2.0000 g | INTRAVENOUS | Status: AC
  Administered 2013-01-22: 2 g via INTRAVENOUS

## 2013-01-22 MED ORDER — HYDROMORPHONE HCL PF 1 MG/ML IJ SOLN
0.2500 mg | INTRAMUSCULAR | Status: DC | PRN
  Administered 2013-01-22 (×2): 0.5 mg via INTRAVENOUS

## 2013-01-22 MED ORDER — OXYCODONE HCL 5 MG/5ML PO SOLN: 5.0000 mg | Freq: Once | ORAL | Status: AC | PRN

## 2013-01-22 MED ORDER — OXYCODONE HCL 5 MG PO TABS
5.0000 mg | ORAL_TABLET | Freq: Once | ORAL | Status: AC | PRN
  Administered 2013-01-22: 5 mg via ORAL

## 2013-01-22 MED ORDER — LACTATED RINGERS IV SOLN
INTRAVENOUS | Status: DC
  Administered 2013-01-22 (×2): via INTRAVENOUS

## 2013-01-22 MED ORDER — MIDAZOLAM HCL 2 MG/2ML IJ SOLN
1.0000 mg | INTRAMUSCULAR | Status: DC | PRN
  Administered 2013-01-22: 2 mg via INTRAVENOUS

## 2013-01-22 MED ORDER — ONDANSETRON HCL 4 MG/2ML IJ SOLN
INTRAMUSCULAR | Status: DC | PRN
  Administered 2013-01-22: 4 mg via INTRAVENOUS

## 2013-01-22 SURGICAL SUPPLY — 46 items
APL SKNCLS STERI-STRIP NONHPOA (GAUZE/BANDAGES/DRESSINGS) ×1
BENZOIN TINCTURE PRP APPL 2/3 (GAUZE/BANDAGES/DRESSINGS) ×2 IMPLANT
BLADE HEX COATED 2.75 (ELECTRODE) ×2 IMPLANT
BLADE SURG 10 STRL SS (BLADE) ×2 IMPLANT
BLADE SURG ROTATE 9660 (MISCELLANEOUS) ×2 IMPLANT
CHLORAPREP W/TINT 26ML (MISCELLANEOUS) ×2 IMPLANT
CLOTH BEACON ORANGE TIMEOUT ST (SAFETY) ×2 IMPLANT
COVER MAYO STAND STRL (DRAPES) ×2 IMPLANT
COVER TABLE BACK 60X90 (DRAPES) ×2 IMPLANT
DECANTER SPIKE VIAL GLASS SM (MISCELLANEOUS) ×1 IMPLANT
DRAIN PENROSE 1/2X12 LTX STRL (WOUND CARE) ×2 IMPLANT
DRAPE PED LAPAROTOMY (DRAPES) ×2 IMPLANT
DRAPE UTILITY XL STRL (DRAPES) ×2 IMPLANT
DRSG TEGADERM 4X4.75 (GAUZE/BANDAGES/DRESSINGS) ×2 IMPLANT
ELECT REM PT RETURN 9FT ADLT (ELECTROSURGICAL) ×2
ELECTRODE REM PT RTRN 9FT ADLT (ELECTROSURGICAL) ×1 IMPLANT
GAUZE SPONGE 4X4 12PLY STRL LF (GAUZE/BANDAGES/DRESSINGS) ×2 IMPLANT
GLOVE BIO SURGEON STRL SZ 6.5 (GLOVE) ×2 IMPLANT
GLOVE INDICATOR 7.0 STRL GRN (GLOVE) ×1 IMPLANT
GLOVE SURG SIGNA 7.5 PF LTX (GLOVE) ×2 IMPLANT
GOWN PREVENTION PLUS XLARGE (GOWN DISPOSABLE) ×2 IMPLANT
GOWN PREVENTION PLUS XXLARGE (GOWN DISPOSABLE) ×2 IMPLANT
MESH PARIETEX PROGRIP LEFT (Mesh General) ×1 IMPLANT
NDL HYPO 25X1 1.5 SAFETY (NEEDLE) ×1 IMPLANT
NEEDLE HYPO 22GX1.5 SAFETY (NEEDLE) ×2 IMPLANT
NEEDLE HYPO 25X1 1.5 SAFETY (NEEDLE) ×2 IMPLANT
NS IRRIG 1000ML POUR BTL (IV SOLUTION) ×1 IMPLANT
PACK BASIN DAY SURGERY FS (CUSTOM PROCEDURE TRAY) ×2 IMPLANT
PENCIL BUTTON HOLSTER BLD 10FT (ELECTRODE) ×2 IMPLANT
SLEEVE SCD COMPRESS KNEE MED (MISCELLANEOUS) ×1 IMPLANT
SPONGE INTESTINAL PEANUT (DISPOSABLE) IMPLANT
SPONGE LAP 4X18 X RAY DECT (DISPOSABLE) ×2 IMPLANT
STRIP CLOSURE SKIN 1/2X4 (GAUZE/BANDAGES/DRESSINGS) ×2 IMPLANT
SUT MNCRL AB 4-0 PS2 18 (SUTURE) ×2 IMPLANT
SUT SILK 2 0 SH (SUTURE) IMPLANT
SUT SURG 0 T 19/GS 22 1969 62 (SUTURE) IMPLANT
SUT VIC AB 2-0 CT1 27 (SUTURE) ×6
SUT VIC AB 2-0 CT1 TAPERPNT 27 (SUTURE) ×1 IMPLANT
SUT VIC AB 3-0 CT1 27 (SUTURE) ×2
SUT VIC AB 3-0 CT1 27XBRD (SUTURE) ×1 IMPLANT
SUT VICRYL AB 3 0 TIES (SUTURE) ×2 IMPLANT
SYR BULB 3OZ (MISCELLANEOUS) IMPLANT
SYR CONTROL 10ML LL (SYRINGE) ×2 IMPLANT
TOWEL OR 17X24 6PK STRL BLUE (TOWEL DISPOSABLE) ×4 IMPLANT
TOWEL OR NON WOVEN STRL DISP B (DISPOSABLE) ×2 IMPLANT
WATER STERILE IRR 1000ML POUR (IV SOLUTION) ×1 IMPLANT

## 2013-01-22 NOTE — Transfer of Care (Signed)
Immediate Anesthesia Transfer of Care Note  Patient: Denise Deleon  Procedure(s) Performed: Procedure(s) with comments: HERNIA REPAIR INGUINAL ADULT (Left) INSERTION OF MESH (Left) - Left Inguinal Hernia Repair  Patient Location: PACU  Anesthesia Type:GA combined with regional for post-op pain  Level of Consciousness: awake, alert , oriented and patient cooperative  Airway & Oxygen Therapy: Patient Spontanous Breathing and Patient connected to face mask oxygen  Post-op Assessment: Report given to PACU RN and Post -op Vital signs reviewed and stable  Post vital signs: Reviewed and stable  Complications: No apparent anesthesia complications

## 2013-01-22 NOTE — Anesthesia Preprocedure Evaluation (Addendum)
Anesthesia Evaluation  Patient identified by MRN, date of birth, ID band Patient awake    Reviewed: Allergy & Precautions, H&P , NPO status , Patient's Chart, lab work & pertinent test results  Airway Mallampati: II TM Distance: >3 FB     Dental  (+) Teeth Intact and Dental Advisory Given   Pulmonary  breath sounds clear to auscultation        Cardiovascular hypertension, Pt. on medications + dysrhythmias (Pt had ablation in 2011 with good control of heart rate at present) Rhythm:Regular Rate:Normal     Neuro/Psych    GI/Hepatic   Endo/Other  Morbid obesity  Renal/GU      Musculoskeletal   Abdominal   Peds  Hematology   Anesthesia Other Findings   Reproductive/Obstetrics                          Anesthesia Physical Anesthesia Plan  ASA: III  Anesthesia Plan: General   Post-op Pain Management:    Induction: Intravenous  Airway Management Planned: LMA  Additional Equipment:   Intra-op Plan:   Post-operative Plan: Extubation in OR  Informed Consent: I have reviewed the patients History and Physical, chart, labs and discussed the procedure including the risks, benefits and alternatives for the proposed anesthesia with the patient or authorized representative who has indicated his/her understanding and acceptance.   Dental advisory given  Plan Discussed with: CRNA, Anesthesiologist and Surgeon  Anesthesia Plan Comments:         Anesthesia Quick Evaluation

## 2013-01-22 NOTE — Interval H&P Note (Signed)
History and Physical Interval Note: no change in H and P  01/22/2013 7:04 AM  Denise Deleon  has presented today for surgery, with the diagnosis of left inguinal hernia   The various methods of treatment have been discussed with the patient and family. After consideration of risks, benefits and other options for treatment, the patient has consented to  Procedure(s): HERNIA REPAIR INGUINAL ADULT (Left) INSERTION OF MESH (Left) as a surgical intervention .  The patient's history has been reviewed, patient examined, no change in status, stable for surgery.  I have reviewed the patient's chart and labs.  Questions were answered to the patient's satisfaction.     BLACKMAN,DOUGLAS A

## 2013-01-22 NOTE — Anesthesia Procedure Notes (Signed)
Procedure Name: LMA Insertion Date/Time: 01/22/2013 7:47 AM Performed by: Verlan Friends Pre-anesthesia Checklist: Patient identified, Emergency Drugs available, Suction available, Patient being monitored and Timeout performed Patient Re-evaluated:Patient Re-evaluated prior to inductionOxygen Delivery Method: Circle System Utilized Preoxygenation: Pre-oxygenation with 100% oxygen Intubation Type: IV induction Ventilation: Mask ventilation without difficulty LMA: LMA with gastric port inserted LMA Size: 4.0 Number of attempts: 1 Placement Confirmation: positive ETCO2 Tube secured with: Tape Dental Injury: Teeth and Oropharynx as per pre-operative assessment

## 2013-01-22 NOTE — Progress Notes (Signed)
Assisted Dr. Crews with left, ultrasound guided, transabdominal plane block. Side rails up, monitors on throughout procedure. See vital signs in flow sheet. Tolerated Procedure well. 

## 2013-01-22 NOTE — Anesthesia Postprocedure Evaluation (Signed)
  Anesthesia Post-op Note  Patient: Denise Deleon  Procedure(s) Performed: Procedure(s) with comments: HERNIA REPAIR INGUINAL ADULT (Left) INSERTION OF MESH (Left) - Left Inguinal Hernia Repair  Patient Location: PACU  Anesthesia Type:GA combined with regional for post-op pain  Level of Consciousness: awake, alert  and oriented  Airway and Oxygen Therapy: Patient Spontanous Breathing and Patient connected to nasal cannula oxygen  Post-op Pain: none  Post-op Assessment: Post-op Vital signs reviewed  Post-op Vital Signs: Reviewed  Complications: No apparent anesthesia complications

## 2013-01-22 NOTE — Op Note (Signed)
HERNIA REPAIR INGUINAL ADULT, INSERTION OF MESH  Procedure Note  Denise Deleon 01/22/2013   Pre-op Diagnosis: left inguinal hernia      Post-op Diagnosis: same  Procedure(s): LEFT INGUINAL HERNIA REPAIR WITH MESH (PROGRIP)  Surgeon(s): Shelly Rubenstein, MD  Anesthesia: General  Staff:  Circulator: Aleen Sells, RN Scrub Person: Joie Bimler Neiers, CST  Estimated Blood Loss: Minimal               Procedure: The patient was brought to the operating room and identified as the correct patient. She was placed supine on the operating room table and general anesthesia was induced. Her abdomen was then prepped and draped in the usual sterile fashion. I anesthetized the skin in the left lower quadrant with Marcaine. I then made Deleon longitudinal incision with the scalpel. I took this down through Scarpa's fascia with electrocautery. The external oblique fascia was identified and opened with internal and external rings. The patient had Deleon very small hernia defect along the round ligament. I excised the sac and closed the fascia defect with Deleon figure-of-eight 2-0 Vicryl suture. I then brought Deleon piece of Progrip prolene mesh on to the field. I placed the mesh into the left inguinal floor and secured in place with Deleon 2-0 Vicryl suture. Good coverage of the inguinal floor appeared to be achieved. Then I closed the external oblique fascia over the top of this with Deleon running 2-0 Vicryl suture. I anesthetized the tissue further with Marcaine. I then closed the subcutaneous tissue with 3-0 Vicryl 2 sutures and closed the skin with Deleon running 4-0 Monocryl. Steri-Strips, gauze, and tape were then applied. The patient tolerated the procedure well. All the counts were correct at the end of the procedure. The patient was then extubated in the operating room and taken in Deleon stable condition to the recovery room.          Denise Deleon   Date: 01/22/2013  Time: 8:23 AM

## 2013-01-25 ENCOUNTER — Encounter (HOSPITAL_BASED_OUTPATIENT_CLINIC_OR_DEPARTMENT_OTHER): Payer: Self-pay | Admitting: Surgery

## 2013-01-25 ENCOUNTER — Telehealth (INDEPENDENT_AMBULATORY_CARE_PROVIDER_SITE_OTHER): Payer: Self-pay | Admitting: General Surgery

## 2013-01-25 NOTE — Telephone Encounter (Signed)
I attempted to contact this patient x4  And was unable to reach . Appt for p/o f/u was mailed to this patient .

## 2013-02-09 ENCOUNTER — Ambulatory Visit (INDEPENDENT_AMBULATORY_CARE_PROVIDER_SITE_OTHER): Payer: BC Managed Care – PPO | Admitting: Surgery

## 2013-02-09 ENCOUNTER — Encounter (INDEPENDENT_AMBULATORY_CARE_PROVIDER_SITE_OTHER): Payer: Self-pay | Admitting: Surgery

## 2013-02-09 VITALS — HR 70 | Temp 98.4°F | Resp 18 | Ht 64.75 in | Wt 268.0 lb

## 2013-02-09 DIAGNOSIS — Z09 Encounter for follow-up examination after completed treatment for conditions other than malignant neoplasm: Secondary | ICD-10-CM

## 2013-02-09 NOTE — Progress Notes (Signed)
Subjective:     Patient ID: Denise Deleon, female   DOB: 1956/04/14, 57 y.o.   MRN: [id number]  HPI She is here for her first postop visit status post left inguinal hernia repair with mesh. She is doing very well has no complaints.  Review of Systems     Objective:   Physical Exam On exam, her incision is healing well without evidence of recurrence    Assessment:     Patient stable postop     Plan:     She has already returned to work. She will refrain from any lifting until April 7. I will see her back as needed

## 2013-07-20 ENCOUNTER — Encounter (INDEPENDENT_AMBULATORY_CARE_PROVIDER_SITE_OTHER): Payer: Self-pay | Admitting: Surgery

## 2013-09-23 ENCOUNTER — Other Ambulatory Visit: Payer: Self-pay

## 2013-12-21 ENCOUNTER — Other Ambulatory Visit: Payer: Self-pay

## 2013-12-21 DIAGNOSIS — Z1231 Encounter for screening mammogram for malignant neoplasm of breast: Secondary | ICD-10-CM

## 2013-12-29 ENCOUNTER — Ambulatory Visit: Payer: BC Managed Care – PPO

## 2013-12-31 ENCOUNTER — Ambulatory Visit
Admission: RE | Admit: 2013-12-31 | Discharge: 2013-12-31 | Disposition: A | Discharge status: Home / Self Care | Payer: BC Managed Care – PPO | Source: Ambulatory Visit

## 2013-12-31 ENCOUNTER — Ambulatory Visit: Payer: BC Managed Care – PPO

## 2013-12-31 DIAGNOSIS — Z1231 Encounter for screening mammogram for malignant neoplasm of breast: Secondary | ICD-10-CM

## 2014-08-05 ENCOUNTER — Ambulatory Visit (INDEPENDENT_AMBULATORY_CARE_PROVIDER_SITE_OTHER): Payer: BC Managed Care – PPO | Admitting: Interventional Cardiology

## 2014-08-05 ENCOUNTER — Encounter: Payer: Self-pay | Admitting: Interventional Cardiology

## 2014-08-05 VITALS — BP 122/86 | HR 84 | Ht 65.0 in | Wt 271.0 lb

## 2014-08-05 DIAGNOSIS — I456 Pre-excitation syndrome: Secondary | ICD-10-CM

## 2014-08-05 DIAGNOSIS — I10 Essential (primary) hypertension: Secondary | ICD-10-CM

## 2014-08-05 DIAGNOSIS — I4891 Unspecified atrial fibrillation: Secondary | ICD-10-CM

## 2014-08-05 DIAGNOSIS — I480 Paroxysmal atrial fibrillation: Secondary | ICD-10-CM

## 2014-08-05 NOTE — Progress Notes (Signed)
Patient ID: Denise Deleon, female   DOB: 15-Jan-1956, 58 y.o.   MRN: [id number]    [address] [place], [place]  [address] Phone: [phone] Fax:  [phone]  Date:  08/05/2014   ID:  Denise Deleon, DOB November 30, 1955, MRN [id number]  PCP:  Katy Apo, MD   ASSESSMENT:  1. History of WPW, status post successful ablation by Dr. Gilman Schmidt 2011 2. No recurrence of atrial fibrillation 3. Morbid obesity 4. Hypertension controlled  PLAN:  1. No specific cardiac measures are needed at this time 2. Cautioned to call if recurrent palpitations/arrhythmia is 3. Clinical followup as needed 4. Encouraged consideration of bariatric surgery as had already been discussed by Dr. polite   SUBJECTIVE: Denise Deleon is a 58 y.o. female was doing well from a cardiac standpoint. She had successful ablation of an accessory pathway and 2011. She's had no prolonged palpitations or other cardiac complaints. She denies orthopnea and PND.   Wt Readings from Last 3 Encounters:  08/05/14 271 lb (122.925 kg)  02/09/13 268 lb (121.564 kg)  01/22/13 260 lb 4 oz (118.049 kg)     Past Medical History  Diagnosis Date  . Anemia   . Hypertension   . Wolf-Parkinson-White syndrome     ablasion 2011  . Rhematoid Artthritis 01/2012  . Bronchitis     hx  . Wears glasses     Current Outpatient Prescriptions  Medication Sig Dispense Refill  . aspirin 81 MG tablet Take 81 mg by mouth daily.      . calcium carbonate 200 MG capsule Take 250 mg by mouth 2 (two) times daily with a meal.      . Cholecalciferol (VITAMIN D PO) Take 1 tablet by mouth daily.      . ferrous gluconate (FERGON) 324 MG tablet Take 324 mg by mouth daily with breakfast.      . folic acid (FOLVITE) 1 MG tablet Take 2 mg by mouth daily.      Marland Kitchen HUMIRA 40 MG/0.8ML PSKT       . losartan-hydrochlorothiazide (HYZAAR) 50-12.5 MG per tablet Take 1 tablet by mouth daily.      . meloxicam (MOBIC) 15 MG tablet Take 15 mg  by mouth daily.      . methotrexate (RHEUMATREX) 2.5 MG tablet Take 25 mg by mouth once a week.      . methotrexate (RHEUMATREX) 2.5 MG tablet       . VENTOLIN HFA 108 (90 BASE) MCG/ACT inhaler Inhale 1-2 puffs into the lungs every 6 (six) hours as needed.       Marland Kitchen HYDROcodone-acetaminophen (NORCO) 5-325 MG per tablet Take 1-2 tablets by mouth every 4 (four) hours as needed for pain.  30 tablet  1   No current facility-administered medications for this visit.    Allergies:    Allergies  Allergen Reactions  . Diltiazem Hcl Other (See Comments)    Evelene Croon Parkinson White syndrom    Social History:  The patient  reports that she has never smoked. She has never used smokeless tobacco. She reports that she does not drink alcohol or use illicit drugs.   ROS:  Please see the history of present illness.   No neurological complaints   All other systems reviewed and negative.   OBJECTIVE: VS:  BP 122/86  Pulse 84  Ht  (1.651 m)  Wt 271 lb (122.925 kg)  BMI 45.10 kg/m2 Well nourished, well developed, in no acute  distress, obese HEENT: normal Neck: JVD flat. Carotid bruit absent  Cardiac:  normal S1, S2; RRR; no murmur Lungs:  clear to auscultation bilaterally, no wheezing, rhonchi or rales Abd: soft, nontender, no hepatomegaly Ext: Edema absent. Pulses 2+ and symmetric Skin: warm and dry Neuro:  CNs 2-12 intact, no focal abnormalities noted  EKG:  Normal sinus rhythm with left atrial abnormality and poor R-wave progression.       Signed, Darci Needle III, MD 08/05/2014 3:35 PM

## 2014-08-05 NOTE — Patient Instructions (Signed)
Your physician recommends that you continue on your current medications as directed. Please refer to the Current Medication list given to you today.   Your physician recommends that you schedule a follow-up appointment as needed  

## 2014-08-25 ENCOUNTER — Telehealth: Payer: Self-pay | Admitting: Interventional Cardiology

## 2014-08-25 DIAGNOSIS — R0789 Other chest pain: Secondary | ICD-10-CM

## 2014-08-25 DIAGNOSIS — R0602 Shortness of breath: Secondary | ICD-10-CM

## 2014-08-25 NOTE — Telephone Encounter (Signed)
New message          C/o sob, chest tightness, light headedness / pt is requesting to be seen today

## 2014-08-25 NOTE — Telephone Encounter (Signed)
Vital signs at work-Bp 153/91 p 72, O2 sat 99%. Per verbal order Dr. Mendel RyderH Smith, needs myocardial perfusion test, posssibly chemical induced. She may need to do 2 day study, due to weight. Patient is agreeable to this plan.ordered in Epic. [facility]

## 2014-08-25 NOTE — Telephone Encounter (Signed)
Patient calls this am feeling SOB, some chest tightness, is lightheaded and has a tightness in her throat. Has had this x 2 days now. Feels warm, but is not diaphoretic. Had a burning towards her l shoulder yesterday.

## 2014-08-29 ENCOUNTER — Encounter (HOSPITAL_COMMUNITY): Payer: BC Managed Care – PPO | Attending: Interventional Cardiology | Admitting: Radiology

## 2014-08-29 VITALS — BP 124/82 | Ht 65.0 in | Wt 268.0 lb

## 2014-08-29 DIAGNOSIS — R0602 Shortness of breath: Secondary | ICD-10-CM | POA: Insufficient documentation

## 2014-08-29 DIAGNOSIS — R0789 Other chest pain: Secondary | ICD-10-CM | POA: Diagnosis present

## 2014-08-29 DIAGNOSIS — I480 Paroxysmal atrial fibrillation: Secondary | ICD-10-CM

## 2014-08-29 MED ORDER — TECHNETIUM TC 99M SESTAMIBI GENERIC - CARDIOLITE
33.0000 | Freq: Once | INTRAVENOUS | Status: AC | PRN
Start: 2014-08-29 — End: 2014-08-29
  Administered 2014-08-29: 33 via INTRAVENOUS

## 2014-08-29 MED ORDER — TECHNETIUM TC 99M SESTAMIBI GENERIC - CARDIOLITE
33.0000 | Freq: Once | INTRAVENOUS | Status: AC | PRN
Start: 2014-08-29 — End: 2014-08-29

## 2014-08-29 MED ORDER — REGADENOSON 0.4 MG/5ML IV SOLN
0.4000 mg | Freq: Once | INTRAVENOUS | Status: AC
  Administered 2014-08-29: 0.4 mg via INTRAVENOUS

## 2014-08-29 MED ORDER — REGADENOSON 0.4 MG/5ML IV SOLN: 0.4000 mg | Freq: Once | INTRAVENOUS | Status: AC

## 2014-08-29 NOTE — Progress Notes (Signed)
[facility] S[facility] NUCLEAR MED [address] [place], [place] [address] [phone]    Cardiology Nuclear Med Study  Catha NottinghamDelinda D Deleon is a 58 y.o. female     MRN : [id number]     DOB: 10-08-1956  Procedure Date: 08/29/2014  Nuclear Med Background Indication for Stress Test:  Evaluation for Ischemia History:  AFIB-Ablation, H/O WPW Cardiac Risk Factors: Hypertension  Symptoms:  Chest Tightness (last date of chest discomfort 4 days ago), DOE, Palpitations and SOB   Nuclear Pre-Procedure Caffeine/Decaff Intake:  None> 12 hrs NPO After: 8:00pm   Lungs:  clear O2 Sat: 98% on room air. IV 0.9% NS with Angio Cath:  22g  IV Site: R Forearm x 1, tolerated well IV Started by:  Irean HongPatsy Edwards, RN  Chest Size (in):  40 Cup Size: B  Height: 5\' 5"  (1.651 m)  Weight:  268 lb (121.564 kg)  BMI:  Body mass index is 44.6 kg/(m^2). Tech Comments:  Hyzaar this am. Irean HongPatsy Edwards, RN.    Nuclear Med Study 1 or 2 day study: 2 day  Stress Test Type:  Eugenie BirksLexiscan  Reading MD: N/A  Order Authorizing Provider:  Verdis PrimeHenry Smith, III, MD  Resting Radionuclide: Technetium 3593m Sestamibi  Resting Radionuclide Dose: 33.0 mCi on 09/01/2014  Stress Radionuclide:  Technetium 2493m Sestamibi  Stress Radionuclide Dose: 33.0 mCi on 08/29/2014          Stress Protocol Rest HR: 75 Stress HR: 106  Rest BP: 124/82 Stress BP: 144/76  Exercise Time (min): n/a METS: n/a   Predicted Max HR: 164 bpm % Max HR: 64.63 bpm Rate Pressure Product: [id number]   Dose of Adenosine (mg):  n/a Dose of Lexiscan: 0.4 mg  Dose of Atropine (mg): n/a Dose of Dobutamine: n/a mcg/kg/min (at max HR)  Stress Test Technologist: Milana NaSabrina Williams, EMT-P  Nuclear Technologist:  Kerby NoraElzbieta Kubak, CNMT     Rest Procedure:  Myocardial perfusion imaging was performed at rest 45 minutes following the intravenous administration of Technetium 10893m Sestamibi. Rest ECG: NSR - Normal EKG  Stress Procedure:  The patient received IV Lexiscan 0.4 mg  over 15-seconds.  Technetium 5793m Sestamibi injected at 30-seconds. This patient had sob, chest burning/tightness, headache, and nausea with the Lexiscan injection. Quantitative spect images were obtained after a 45 minute delay. Stress ECG: No significant change from baseline ECG, frequent monomorphic PVCs  QPS Raw Data Images:  Normal; no motion artifact; normal heart/lung ratio. Stress Images:  Normal homogeneous uptake in all areas of the myocardium. Rest Images:  Normal homogeneous uptake in all areas of the myocardium. Subtraction (SDS):  No evidence of ischemia. Transient Ischemic Dilatation (Normal <1.22):  1.04 Lung/Heart Ratio (Normal <0.45):  0.27  Quantitative Gated Spect Images QGS EDV:  99 ml QGS ESV:  32 ml  Impression Exercise Capacity:  Lexiscan with no exercise. BP Response:  Normal blood pressure response. Clinical Symptoms:  Mild chest pain/dyspnea. ECG Impression:  No significant ST segment change suggestive of ischemia. Comparison with Prior Nuclear Study: No previous nuclear study performed  Overall Impression:  Normal stress nuclear study.  LV Ejection Fraction: 68%.  LV Wall Motion:  NL LV Function; NL Wall Motion   Lars MassonELSON, Denise Deleon H 09/01/2014

## 2014-09-01 ENCOUNTER — Ambulatory Visit (HOSPITAL_COMMUNITY): Payer: BC Managed Care – PPO | Attending: Cardiology

## 2014-09-01 DIAGNOSIS — R0989 Other specified symptoms and signs involving the circulatory and respiratory systems: Secondary | ICD-10-CM

## 2014-09-01 MED ORDER — TECHNETIUM TC 99M SESTAMIBI GENERIC - CARDIOLITE
33.0000 | Freq: Once | INTRAVENOUS | Status: AC | PRN
  Administered 2014-09-01: 33 via INTRAVENOUS

## 2014-09-06 ENCOUNTER — Telehealth: Payer: Self-pay | Admitting: Interventional Cardiology

## 2014-09-06 NOTE — Telephone Encounter (Signed)
Message copied by Jarvis NewcomerPARRIS-GODLEY, LISA S on Tue Sep 06, 2014  5:01 PM ------      Message from: Verdis PrimeSMITH, HENRY      Created: Mon Sep 05, 2014 10:31 AM       Normal study ------

## 2014-09-06 NOTE — Telephone Encounter (Signed)
Pt aware of myoview results.Normal study. Pt verbalized understanding.pt sts that she is still experiencing intermittent chest tightness and sob.pt would like to know if Dr.Smith thinks any further cardiac work up is needed. Pt ad I will fwd a message to him and call back with his recommendations. Pt was thankful and verbalized understanding.

## 2014-09-06 NOTE — Telephone Encounter (Signed)
Message copied by Jarvis NewcomerPARRIS-GODLEY, LISA S on Tue Sep 06, 2014  5:06 PM ------      Message from: Verdis PrimeSMITH, HENRY      Created: Mon Sep 05, 2014 10:31 AM       Normal study ------

## 2014-09-06 NOTE — Telephone Encounter (Signed)
New message ° ° ° ° ° °Returning a nurses call °

## 2014-12-26 ENCOUNTER — Other Ambulatory Visit: Payer: Self-pay

## 2014-12-26 DIAGNOSIS — Z1231 Encounter for screening mammogram for malignant neoplasm of breast: Secondary | ICD-10-CM

## 2015-01-05 ENCOUNTER — Ambulatory Visit
Admission: RE | Admit: 2015-01-05 | Discharge: 2015-01-05 | Disposition: A | Discharge status: Home / Self Care | Payer: BLUE CROSS/BLUE SHIELD | Source: Ambulatory Visit

## 2015-01-05 DIAGNOSIS — Z1231 Encounter for screening mammogram for malignant neoplasm of breast: Secondary | ICD-10-CM

## 2016-03-19 ENCOUNTER — Ambulatory Visit (INDEPENDENT_AMBULATORY_CARE_PROVIDER_SITE_OTHER): Payer: BLUE CROSS/BLUE SHIELD | Admitting: Podiatry

## 2016-03-19 ENCOUNTER — Ambulatory Visit (INDEPENDENT_AMBULATORY_CARE_PROVIDER_SITE_OTHER): Payer: BLUE CROSS/BLUE SHIELD

## 2016-03-19 ENCOUNTER — Encounter: Payer: Self-pay | Admitting: Podiatry

## 2016-03-19 VITALS — BP 129/72 | HR 74 | Resp 16 | Ht 64.0 in | Wt 263.0 lb

## 2016-03-19 DIAGNOSIS — M2142 Flat foot [pes planus] (acquired), left foot: Secondary | ICD-10-CM

## 2016-03-19 DIAGNOSIS — M79674 Pain in right toe(s): Secondary | ICD-10-CM

## 2016-03-19 DIAGNOSIS — M7751 Other enthesopathy of right foot: Secondary | ICD-10-CM

## 2016-03-19 DIAGNOSIS — M2141 Flat foot [pes planus] (acquired), right foot: Secondary | ICD-10-CM | POA: Diagnosis not present

## 2016-03-19 DIAGNOSIS — M71571 Other bursitis, not elsewhere classified, right ankle and foot: Secondary | ICD-10-CM

## 2016-03-19 DIAGNOSIS — Q828 Other specified congenital malformations of skin: Secondary | ICD-10-CM

## 2016-03-19 NOTE — Progress Notes (Signed)
   Subjective:    Patient ID: Denise Deleon, female    DOB: 12-14-1955, 60 y.o.   MRN: [id number]  HPI: She presents today with a chief complaint of painful corns and calluses forefoot bilateral. Her most painful lesion is associated with plantar lateral aspect of the fifth metatarsal of the right foot. She states that she has very wide flat feet and his extremely hard to find shoes that fit her feet.    Review of Systems  Eyes: Positive for redness and itching.  Musculoskeletal: Positive for arthralgias.  Neurological: Positive for numbness.       Objective:   Physical Exam: Vital signs are stable alert and oriented 3. Pulses are strongly palpable. Neurologic sensorium is intact. Deep tendon reflexes are intact bilateral and muscle strength +5 over 5 dorsiflexion plantar flexors and inverters everters all intrinsic musculature is intact. Orthopedic evaluation does demonstrate severe has planus with a very large wide foot with minimal edema. Multiple porokeratosis of the forefoot bilateral. Some fifth metatarsal head pain with a palpable bursa and reactive area of hyperkeratosis. She also has a reactive hyperkeratotic lesion to the lateral aspect of the fifth digit right foot which does demonstrate a mild adductor varus rotated hammertoe deformity.        Assessment & Plan:  Assessment: Bursitis capsulitis of fifth metatarsophalangeal joint of the left foot. 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  Plan: Discussed etiology pathology conservative versus surgical therapies. Discussed appropriate shoe gear and shoe with. I injected the fifth metatarsophalangeal joints all easily today debridement already hyperkeratoses follow up with her on an as-needed basis.

## 2018-01-22 ENCOUNTER — Encounter: Payer: Self-pay | Admitting: Podiatry

## 2018-01-22 ENCOUNTER — Ambulatory Visit (INDEPENDENT_AMBULATORY_CARE_PROVIDER_SITE_OTHER): Payer: Managed Care, Other (non HMO)

## 2018-01-22 ENCOUNTER — Ambulatory Visit: Payer: Managed Care, Other (non HMO) | Admitting: Podiatry

## 2018-01-22 DIAGNOSIS — Q828 Other specified congenital malformations of skin: Secondary | ICD-10-CM

## 2018-01-22 DIAGNOSIS — M25571 Pain in right ankle and joints of right foot: Secondary | ICD-10-CM | POA: Diagnosis not present

## 2018-01-22 DIAGNOSIS — M79673 Pain in unspecified foot: Secondary | ICD-10-CM | POA: Diagnosis not present

## 2018-01-22 DIAGNOSIS — M2142 Flat foot [pes planus] (acquired), left foot: Secondary | ICD-10-CM

## 2018-01-22 DIAGNOSIS — M2141 Flat foot [pes planus] (acquired), right foot: Secondary | ICD-10-CM | POA: Diagnosis not present

## 2018-01-22 DIAGNOSIS — M722 Plantar fascial fibromatosis: Secondary | ICD-10-CM

## 2018-01-22 NOTE — Patient Instructions (Signed)

## 2018-01-24 NOTE — Progress Notes (Signed)
Subjective: 62 year old female presents the office today with concerns mostly of right foot pain.  She states that she has pain in the arch of her foot but she did once when she first gets up.  She states that she will have pain quite a bit in the morning to the point where she will have to call out of work because of the pain.  She also gets pain along the inside aspect of the ankle at times.  She also has calluses to both of her feet.  She states the pain to her right foot is been ongoing for about 3-4 months.  She states the pain is intermittent.  In the mornings when she first gets up she has pain after she starts to walk she does better by the end of being on her feet all day she is limping with pain.  She denies any recent injury or trauma she denies any swelling or redness.  She denies any claudication symptoms.  The pain does not wake her up at night.  She has no other concerns today. Denies any systemic complaints such as fevers, chills, nausea, vomiting. No acute changes since last appointment, and no other complaints at this time.   She does have rheumatoid arthritis and she is currently on Humira for this.  Objective: AAO x3, NAD DP/PT pulses palpable 2/4 bilaterally, CRT less than 3 seconds Majority of tenderness appears to be along the medial band of plantar fascia in the arch of the foot and on the plantar medial tubercle of the calcaneus at the insertion of the plantar fascia.  Plantar fascia appears to be intact.  No discomfort on the Achilles tendon this appears to be intact.  There is no area pinpoint bony tenderness or pain to vibratory sensation bilaterally.  There is a decrease in medial arch upon weightbearing and equinus is present.  Hyperkeratotic lesions to the right fifth toe as well as submetatarsal 5 on the left foot.  There is tenderness palpation of these areas.  Mild discomfort along the insertion of the posterior tibial tendon into the navicular tuberosity.  The posterior  tibial tendon appears to be intact.  Upon debridement there is no underlying ulceration, drainage or any clinical signs of infection noted today.  Negative Tinel sign is present bilaterally. No open lesions or pre-ulcerative lesions.  No pain with calf compression, swelling, warmth, erythema  Assessment: Bilateral foot pain right side worse than left likely result of plantar fasciitis, flatfoot deformity  Plan: -All treatment options discussed with the patient including all alternatives, risks, complications.  -X-rays were obtained and reviewed.  There is no definitive evidence of acute fracture or stress fracture identified at this time. -We discussed a steroid injection today.  She wished to proceed.  See procedure note below. -Long-term she will benefit from a custom molded orthotic.  We will check orthotic coverage and have her come back in to be molded for this. -Stretching, icing exercises daily. -Plantar fascial brace dispensed -Night splint -Sharply debrided the hyperkeratotic lesions x2 without any complications or bleeding. -Follow-up as scheduled or sooner if needed.  Call any questions or concerns meantime.  She agrees with this plan was very thankful today. -Patient encouraged to call the office with any questions, concerns, change in symptoms.   Procedure: Injection Tendon/Ligament Discussed alternatives, risks, complications and verbal consent was obtained.  Location: Right plantar fascia at the glabrous junction; medial approach. Skin Prep: Alcohol. Injectate: 1 cc 0.5% marcaine plain, 1 cc 0.5% Marcaine  plain and, 1 cc kenalog 10. Disposition: Patient tolerated procedure well. Injection site dressed with a band-aid.  Post-injection care was discussed and return precautions discussed.    Vivi BarrackMatthew R Dyllan Kats DPM

## 2018-02-03 ENCOUNTER — Ambulatory Visit: Payer: Managed Care, Other (non HMO) | Admitting: Orthotics

## 2018-02-03 DIAGNOSIS — M722 Plantar fascial fibromatosis: Secondary | ICD-10-CM

## 2018-02-03 DIAGNOSIS — M2141 Flat foot [pes planus] (acquired), right foot: Secondary | ICD-10-CM

## 2018-02-03 DIAGNOSIS — M2142 Flat foot [pes planus] (acquired), left foot: Secondary | ICD-10-CM

## 2018-02-03 DIAGNOSIS — Q828 Other specified congenital malformations of skin: Secondary | ICD-10-CM

## 2018-02-03 NOTE — Progress Notes (Signed)
Patient presents today for CMFO due to pes planovalgus and pes planus, and secondary plantar fasciitis.  Gait anlaysis reveals slight rear foot eversion/pronation.   Plan on f/o UCBL type, Blake Invertered 15* Richy to fab.  $300.00

## 2018-02-25 ENCOUNTER — Other Ambulatory Visit (INDEPENDENT_AMBULATORY_CARE_PROVIDER_SITE_OTHER): Payer: Self-pay | Admitting: Orthotics
